# Patient Record
Sex: Male | Born: 1960 | Race: White | Hispanic: No | Marital: Married | Smoking: Current every day smoker
Health system: Southern US, Community
[De-identification: ages and names within clinical notes are randomized; demographics above are authoritative.]

## PROBLEM LIST (undated history)

## (undated) DIAGNOSIS — I10 Essential (primary) hypertension: Secondary | ICD-10-CM

---

## 2004-10-04 ENCOUNTER — Inpatient Hospital Stay: Payer: Self-pay | Admitting: Cardiovascular Disease

## 2004-10-04 ENCOUNTER — Other Ambulatory Visit: Payer: Self-pay

## 2013-09-26 ENCOUNTER — Observation Stay: Payer: Self-pay | Admitting: Internal Medicine

## 2013-09-26 LAB — CBC
HCT: 43.5 % (ref 40.0–52.0)
HGB: 14.6 g/dL (ref 13.0–18.0)
MCH: 31.9 pg (ref 26.0–34.0)
MCHC: 33.6 g/dL (ref 32.0–36.0)
MCV: 95 fL (ref 80–100)
Platelet: 333 10*3/uL (ref 150–440)
RBC: 4.57 10*6/uL (ref 4.40–5.90)
RDW: 13.7 % (ref 11.5–14.5)
WBC: 16.2 10*3/uL — AB (ref 3.8–10.6)

## 2013-09-26 LAB — COMPREHENSIVE METABOLIC PANEL
ALBUMIN: 3.3 g/dL — AB (ref 3.4–5.0)
ANION GAP: 5 — AB (ref 7–16)
AST: 19 U/L (ref 15–37)
Alkaline Phosphatase: 77 U/L
BILIRUBIN TOTAL: 0.6 mg/dL (ref 0.2–1.0)
BUN: 12 mg/dL (ref 7–18)
CALCIUM: 9 mg/dL (ref 8.5–10.1)
CHLORIDE: 99 mmol/L (ref 98–107)
CO2: 28 mmol/L (ref 21–32)
CREATININE: 0.8 mg/dL (ref 0.60–1.30)
EGFR (African American): >60
Glucose: 131 mg/dL — ABNORMAL HIGH (ref 65–99)
Osmolality: 266 (ref 275–301)
POTASSIUM: 4.3 mmol/L (ref 3.5–5.1)
SGPT (ALT): 19 U/L (ref 12–78)
Sodium: 132 mmol/L — ABNORMAL LOW (ref 136–145)
TOTAL PROTEIN: 7.1 g/dL (ref 6.4–8.2)

## 2013-09-26 LAB — PRO B NATRIURETIC PEPTIDE: B-Type Natriuretic Peptide: 75 pg/mL (ref 0–125)

## 2013-09-26 LAB — CK-MB
CK-MB: 0.5 ng/mL (ref 0.5–3.6)
CK-MB: 0.6 ng/mL (ref 0.5–3.6)
CK-MB: 0.6 ng/mL (ref 0.5–3.6)

## 2013-09-26 LAB — TROPONIN I

## 2013-09-27 LAB — CBC WITH DIFFERENTIAL/PLATELET
BASOS ABS: 0.1 10*3/uL (ref 0.0–0.1)
BASOS PCT: 0.5 %
EOS PCT: 0.3 %
Eosinophil #: 0.1 10*3/uL (ref 0.0–0.7)
HCT: 45.7 % (ref 40.0–52.0)
HGB: 15.1 g/dL (ref 13.0–18.0)
LYMPHS ABS: 2.5 10*3/uL (ref 1.0–3.6)
Lymphocyte %: 17 %
MCH: 31.9 pg (ref 26.0–34.0)
MCHC: 33.1 g/dL (ref 32.0–36.0)
MCV: 96 fL (ref 80–100)
Monocyte #: 1.6 x10 3/mm — ABNORMAL HIGH (ref 0.2–1.0)
Monocyte %: 10.9 %
NEUTROS ABS: 10.5 10*3/uL — AB (ref 1.4–6.5)
NEUTROS PCT: 71.3 %
Platelet: 362 10*3/uL (ref 150–440)
RBC: 4.75 10*6/uL (ref 4.40–5.90)
RDW: 13.9 % (ref 11.5–14.5)
WBC: 14.8 10*3/uL — AB (ref 3.8–10.6)

## 2013-09-27 LAB — BASIC METABOLIC PANEL
Anion Gap: 3 — ABNORMAL LOW (ref 7–16)
BUN: 10 mg/dL (ref 7–18)
CALCIUM: 9 mg/dL (ref 8.5–10.1)
Chloride: 101 mmol/L (ref 98–107)
Co2: 31 mmol/L (ref 21–32)
Creatinine: 1.01 mg/dL (ref 0.60–1.30)
EGFR (African American): >60
EGFR (Non-African Amer.): >60
Glucose: 100 mg/dL — ABNORMAL HIGH (ref 65–99)
OSMOLALITY: 269 (ref 275–301)
Potassium: 4.5 mmol/L (ref 3.5–5.1)
SODIUM: 135 mmol/L — AB (ref 136–145)

## 2013-09-28 LAB — BASIC METABOLIC PANEL
Anion Gap: 5 — ABNORMAL LOW (ref 7–16)
BUN: 10 mg/dL (ref 7–18)
CALCIUM: 8.8 mg/dL (ref 8.5–10.1)
CHLORIDE: 100 mmol/L (ref 98–107)
Co2: 30 mmol/L (ref 21–32)
Creatinine: 0.84 mg/dL (ref 0.60–1.30)
EGFR (Non-African Amer.): >60
GLUCOSE: 105 mg/dL — AB (ref 65–99)
Osmolality: 270 (ref 275–301)
Potassium: 4.4 mmol/L (ref 3.5–5.1)
SODIUM: 135 mmol/L — AB (ref 136–145)

## 2013-09-28 LAB — CBC WITH DIFFERENTIAL/PLATELET
Basophil #: 0.1 10*3/uL (ref 0.0–0.1)
Basophil %: 0.7 %
Eosinophil #: 0.1 10*3/uL (ref 0.0–0.7)
Eosinophil %: 0.7 %
HCT: 44.1 % (ref 40.0–52.0)
HGB: 14.9 g/dL (ref 13.0–18.0)
LYMPHS ABS: 2.5 10*3/uL (ref 1.0–3.6)
LYMPHS PCT: 20.5 %
MCH: 32.5 pg (ref 26.0–34.0)
MCHC: 33.8 g/dL (ref 32.0–36.0)
MCV: 96 fL (ref 80–100)
MONOS PCT: 11.1 %
Monocyte #: 1.4 x10 3/mm — ABNORMAL HIGH (ref 0.2–1.0)
NEUTROS PCT: 67 %
Neutrophil #: 8.3 10*3/uL — ABNORMAL HIGH (ref 1.4–6.5)
Platelet: 386 10*3/uL (ref 150–440)
RBC: 4.58 10*6/uL (ref 4.40–5.90)
RDW: 13.2 % (ref 11.5–14.5)
WBC: 12.4 10*3/uL — AB (ref 3.8–10.6)

## 2013-09-28 LAB — RAPID HIV-1/2 QL/CONFIRM: HIV-1/2, RAPID QL: NEGATIVE

## 2013-10-01 LAB — CULTURE, BLOOD (SINGLE)

## 2013-11-04 ENCOUNTER — Ambulatory Visit: Payer: Self-pay | Admitting: Internal Medicine

## 2013-12-11 ENCOUNTER — Ambulatory Visit: Payer: Self-pay | Admitting: Internal Medicine

## 2014-04-07 ENCOUNTER — Ambulatory Visit: Payer: Self-pay | Admitting: Internal Medicine

## 2014-08-09 NOTE — Consult Note (Signed)
PATIENT NAME:  Harold FoersterLOWE, Elizah R MR#:  [id number] DATE OF BIRTH:  1960-10-08  INFECTIOUS DISEASE CONSULTATION NOTE  DATE OF CONSULTATION:  09/28/2013  REFERRING PHYSICIAN:  Sital P. Juliene PinaMody, MD CONSULTING PHYSICIAN:  Stann Mainlandavid P. Sampson GoonFitzgerald, MD  REASON FOR CONSULTATION: Cavitary pneumonia.   HISTORY OF PRESENT ILLNESS: This is a very pleasant, quite healthy 54 year old gentleman with history of hypertension and GERD, who has recently been trying to quit smoking. He awoke 4 nights ago with left axillary pain and a wet cough and some shortness of breath. The patient came to the Emergency Room, where he was found to have a pneumonia on CAT scan of his chest, abdomen and pelvis. There was some concern for carcinoma of the lung. He underwent a PET scan which did show hypermetabolic activity in the cavitary left upper lobe process, but was more likely to be infection based on short-term progression over just a few days. Currently, the patient feels quite well after being treated with antibiotics. He is having no pain. He is not coughing.   The patient works as a Psychologist, occupationalwelder. He has a Development worker, international aidpet dog and lives with his wife. He has no unusual exposures at work, although recently he was at a dairy farm doing some welding. He has not had any weight loss, night sweats. He has never traveled out of the country and has not been out of the state in over 4 years, [place] which [place]. He does not drink heavily, only drinks a beer every once in a while. No sick contacts either at work or at home.   PAST MEDICAL HISTORY:  1. Hypertension.  2. GERD.  PAST SURGICAL HISTORY: Just a foot surgery.   ALLERGIES: PENICILLIN.    SOCIAL HISTORY: As above.   FAMILY HISTORY: Mother with a history of breast cancer.   ANTIBIOTICS SINCE ADMISSION: Include levofloxacin, meropenem and vancomycin.   REVIEW OF SYSTEMS: Eleven systems reviewed and negative except as per HPI.   PHYSICAL EXAMINATION:  VITAL SIGNS: The patient is  afebrile, temperature 97.8, pulse 64, blood pressure 120/79, respirations 20, saturation 96% on room air.  GENERAL: He is pleasant, well developed, well nourished, in no acute distress. No respiratory distress.  HEENT: Pupils equal, round and reactive to light and accommodation. Extraocular movements are intact. Sclerae are anicteric. His oropharynx is clear, with no thrush.  LYMPH NODES: There is no anterior cervical, posterior cervical or supraclavicular lymphadenopathy.  HEART: Regular.  LUNGS: Clear bilaterally with good air movement.  ABDOMEN: Soft, nontender, nondistended. No hepatosplenomegaly.  EXTREMITIES: There is no clubbing, cyanosis or edema.  SKIN: No rash or lesions. No peripheral stigmata of endocarditis.  MUSCULOSKELETAL: No joint swelling.  NEUROLOGIC: He is alert and oriented x3. Grossly nonfocal neuro exam.   DATA:  White blood count on admission was 16.2, currently it is 12.4, hemoglobin 14.9, platelets 386. Blood cultures x2 were no growth to date from June 11th.  Renal function shows a creatinine of 0.84.  LFTs were normal on admission.  CT scan of the chest, abdomen and pelvis, June 11th, shows left upper lobe lesion worrisome for carcinoma of the lung. There is central low density which may represent tumor necrosis. This could also represent infection.  Followup PET scan done on June 12th showed hypermetabolic activity with progressive central cavitation since the CT the day prior. No other abnormal hypermetabolic areas.   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 He has really no unusual exposures. There is still some concern for malignancy. He did have an elevated white count on admission, and that has improved. There is no evidence of aspiration.   RECOMMENDATIONS:  1. Check an HIV test as well as urine Legionella antigen.  2. Okay to  discharge the patient on levofloxacin for a 10-day course.  3. He will need close followup with his primary care doctor and repeat imaging of his chest in several weeks to follow up on the lesion.  4. Recommended he continue to cut back on his smoking.   Thank you for the consult. I will be glad to follow with you.  ____________________________ Stann Mainland. Sampson Goon, MD dpf:lb D: 09/28/2013 11:00:18 ET T: 09/28/2013 11:14:01 ET JOB#: [id number]  cc: Onalee Hua P. Sampson Goon, MD, <Dictator> DAVID Sampson Goon MD ELECTRONICALLY SIGNED 09/30/2013 9:01

## 2014-08-09 NOTE — H&P (Signed)
PATIENT NAME:  Harold Taylor, Harold Taylor MR#:  [id number] DATE OF BIRTH:  10-12-1960  DATE OF ADMISSION:  09/26/2013  PRIMARY CARE PHYSICIAN: Nonlocal.   REFERRING PHYSICIAN: Dr. Margarita Grizzle.   CHIEF COMPLAINT: Chest pain, shortness of breath.   HISTORY OF PRESENT ILLNESS: The patient is a 54 year old Caucasian male with a past medical history of hypertension and GERD, started having left-sided chest pain mainly in the left axillary area associated with minimal cough and shortness of breath. The patient has waited for some time thinking that the pain will go away, but as the pain  is not going away, and it was persistent, he came to the ED. Chest x-ray has revealed 2.5 cm left hilar focal consolidation with surrounding interstitial prominence, probably pneumonia, but underlying neoplasm could not be ruled out. They had recommended a CAT scan of the chest. ER physician has ordered CAT scan of the chest with contrast, which is pending at this time. The patient was given empiric antibiotic levofloxacin and hospitalist team is called to admit the patient. Initial troponin is negative and 12-lead EKG did not reveal any acute ST-T wave changes. The patient is resting comfortably during my examination. He has admitted some degree of weight loss to the ER physician but denies any to me. He is a smoker and recently cut down to four cigarettes per day. Wife is at bedside.   PAST MEDICAL HISTORY: Hypertension, GERD.  PAST SURGICAL HISTORY: Foot surgery.   ALLERGIES: PENICILLIN.   PSYCHOSOCIAL HISTORY: Lives at home with wife. Smoking for several years. He used to smoke 1 pack a day, but recently he cut it down to four cigarettes per day. Occasional intake of beer. Denies any illicit drug usage.   FAMILY HISTORY: Mother has history of breast cancer.   HOME MEDICATIONS: List need to be updated    REVIEW OF SYSTEMS:  CONSTITUTIONAL: Denies any fever, fatigue.  EYES: Denies blurry vision, double vision.  ENT: Denies  epistaxis, discharge, tinnitus.  RESPIRATORY: Denies wheezing, hemoptysis, complaining of minimal shortness of breath and coughing.  CARDIOVASCULAR: Complaining of left-sided chest pain in the left axillary area. Denies any orthopnea or edema.  GASTROINTESTINAL: Denies nausea, vomiting, diarrhea, abdominal pain. No hematemesis. GENITOURINARY: No dysuria, hematuria, or urinary frequency.  ENDOCRINOLOGIC:  Denies polyuria, nocturia. No heat or cold intolerance.  HEMATOLOGIC AND LYMPHATIC: No anemia, easy bruising, bleeding.  INTEGUMENTARY: No acne, rash, lesions.  MUSCULOSKELETAL: No joint pain in the neck and back. Denies gout.  NEUROLOGIC: Denies vertigo, ataxia, dementia, headache.  PSYCHIATRIC: No ADD, no OCD, insomnia.   PHYSICAL EXAMINATION: VITAL SIGNS: Temperature 99.1, pulse 82, respirations 20, blood pressure 151/88, pulse oximetry is 96% on 2 liters.  GENERAL APPEARANCE: Not in acute distress. Moderately built and nourished.  HEENT: Normocephalic, atraumatic. Pupils are equally reactive to light and accommodation. No scleral icterus. No conjunctival injection. No sinus tenderness. No postnasal drip.  NECK: Supple. No JVD, no thyromegaly.  LUNGS: Positive crackles on the left side of the chest, moderate air entry. chest wall  tenderness on the left side of the chest on palpation.  CARDIAC: S1, S2 normal. Regular rate and rhythm. No murmurs.  GASTROINTESTINAL: Soft. Bowel sounds are positive in all four quadrants. Nontender, nondistended. No hepatosplenomegaly. No masses felt.  NEUROLOGIC: Awake, alert, oriented x 3. Motor and sensory are grossly intact. Cranial nerves II through XII are intact. Reflexes are 2+.  EXTREMITIES: No edema,  no cyanosis. No clubbing.  SKIN: Warm to touch, normal turgor. No rashes. No  lesions.  MUSCULOSKELETAL: No joint effusion, tenderness, erythema.  PSYCHIATRIC: Normal mood and affect.   LABORATORIES AND IMAGING STUDIES: Chest x-ray has revealed 2.7 cm  left hilar focal consolidation with surrounding interstitial prominence, though this could reflect pneumonia. Neoplasm  may have a similar appearance. Recommend CT of the chest with contrast for further characterization. 12- lead EKG: Sinus rhythm at 95 beats per minute, normal PR and QRS interval. No acute ST wave changes. LFTs are normal except albumin at 3.3, troponin less than 0.02. CBC:  hemoglobin and hematocrit and platelet counts are normal. MCV is normal. Chem-8: Glucose 151. Sodium 132. The rest of the Chem-8 is normal.   ASSESSMENT AND PLAN: A 54 year old Caucasian male presenting to the ER with a chief complaint of chest pain associated with left-sided chest pain associated with some shortness of breath and some amount of cough and will be admitted with the following assessment and plan.  1. Atypical chest pain, probably pleuritic from underlying pneumonia. We will admit him to telemetry bed, monitor on telemetry. Cycle cardiac biomarkers to rule out acute myocardial infarction. Provide him baby aspirin.  2. pneumonia with a perihilar mass.  We will give him IV levofloxacin and albuterol nebulizer treatments as needed for shortness of breath.  3. Sputum culture and sensitivity will be obtained.   4. Perihilar mass, rule out underlying cancer. CAT scan of the chest is ordered, which is pending at this time. Rounding physician to follow up on the CAT scan results and if necessary put in oncology consult depending on the results.  5. Hypertension. Continue his home medications benazepril.  6. Gastroesophageal reflux disease. We will provide ranitidine.  7. Deep vein thrombosis prophylaxis with Lovenox subcutaneous.   The patient is full code. Wife is the medical power of attorney. Diagnosis and plan of care was discussed in detail with the patient and his wife at bedside. They both verbalized understanding of the plan.   TOTAL TIME SPENT ON ADMISSION: Is 50 minutes.    __ __________________________ Ramonita LabAruna Tyus Kallam, MD ag:sg D: 09/26/2013 07:34:29 ET T: 09/26/2013 08:05:17 ET JOB#: [id number]  cc: Ramonita LabAruna Jonette Wassel, MD, <Dictator> Primary Care Physician  Ramonita LabARUNA Shunna Mikaelian MD ELECTRONICALLY SIGNED 10/04/2013 0:51

## 2014-08-09 NOTE — Consult Note (Signed)
Brief Consult Note: Diagnosis: lung mass.   Recommend further assessment or treatment.   Comments: came by to see patient who was not in the room. Has gone down for PET scan. I have reviewed the CT scan and this shows a peripheral lesion in the LUL with an area of inflitrate. There is some central area of necrosis also noted. This could be a pneumonic process as well as a lung mass. This could be easily accessed by CT guided needle biopsy and would suggest proceeding with this first and if cannot do it now could do it as an outpatient. The infiltrate however may complicate the biopsy and it may be worthwhile to treat with abx and then repeat CT scan and do a biopy if thre is no improvement. Bronchoscopy not likely to be fruitful due to the location..  Electronic Signatures: Yevonne PaxKhan, Saadat A (MD)  (Signed 12-Jun-15 10:38)  Authored: Brief Consult Note   Last Updated: 12-Jun-15 10:38 by Yevonne PaxKhan, Saadat A (MD)

## 2014-08-09 NOTE — Discharge Summary (Signed)
PATIENT NAME:  Harold Taylor, Harold Taylor MR#:  [id number] DATE OF BIRTH:  [birth year]  DATE OF ADMISSION:  09/26/2013 DATE OF DISCHARGE:  09/28/2013  ADMISSION DIAGNOSES:  1. Chest pain. 2. Pneumonia.   DISCHARGE DIAGNOSES:  1. Cavitary pneumonia. 2. Chest pain secondary to problem number 1. 3. Tobacco dependence. 4. Gastroesophageal reflux disease.  5. Hypertension.   CONSULTATIONS:  1. Infectious disease.  2. Dr. Devona Konig.   IMAGING: The patient had a PET scan which showed cavitary left upper lobe process, likely secondary to cavitary pneumonia.   Blood cultures negative to date.   HOSPITAL COURSE: A 54 year old man male with a history of high blood pressure and GERD who presented with chest pain. For further details, please refer to the H and P.  1. Cavity pneumonia. The patient was placed on broad-spectrum antibiotics for his pneumonia. Blood cultures negative to date. Due to the cavity lesions, he was on vancomycin, Zosyn, and Levaquin. No suspicion for tuberculosis. Appreciate infectious disease consultation. The patient will be discharged with Levaquin. He will need a repeat CT scan about 2-3 weeks and will see Dr. Humphrey Rolls as an outpatient for followup and may need a CT scan. If it does not improve, he will need a bronchoscopy. His chest pain was secondary to cavitary pneumonia.  2. Left upper lobe lung mass initially seen on CAT scan. However, PET scan reveals a cavitary pneumonia.  3. Hypertension. The patient is on Benazepril.  4. Tobacco abuse. The patient was counseled. He is on a nicotine inhaler.  5. Gastroesophageal reflux disease. The patient is on Prilosec home.   DISCHARGE MEDICATIONS:  1. Prilosec 1 tablet daily.  2. Benazepril 20 mg daily. 3. Levaquin 750 mg daily for 10 days.  4. Nicotine inhaler kit every 2-3 hours p.r.n.    DISCHARGE DIET: Regular.   DISCHARGE ACTIVITY: As tolerated.   FOLLOWUP: The patient to follow up with Dr. Devona Konig in 2-3 weeks.   TIME  SPENT: 35 minutes.   The patient was medically stable for discharge    ____________________________ Amandeep Nesmith P. Benjie Karvonen, MD spm:lt D: 09/28/2013 11:11:00 ET T: 09/28/2013 21:36:25 ET JOB#: [id number]  cc: Lion Fernandez P. Benjie Karvonen, MD, <Dictator> Allyne Gee, MD Donell Beers Haydn Hutsell MD ELECTRONICALLY SIGNED 09/29/2013 10:34

## 2014-09-08 ENCOUNTER — Ambulatory Visit: Payer: 59 | Admitting: Podiatry

## 2014-09-10 ENCOUNTER — Ambulatory Visit: Payer: 59 | Admitting: Podiatry

## 2014-10-06 ENCOUNTER — Ambulatory Visit: Payer: 59 | Admitting: Podiatry

## 2014-10-29 ENCOUNTER — Other Ambulatory Visit: Payer: Self-pay | Admitting: Internal Medicine

## 2014-10-29 DIAGNOSIS — J851 Abscess of lung with pneumonia: Secondary | ICD-10-CM

## 2014-11-03 ENCOUNTER — Ambulatory Visit: Payer: 59 | Admitting: Podiatry

## 2014-11-05 ENCOUNTER — Ambulatory Visit
Admission: RE | Admit: 2014-11-05 | Discharge: 2014-11-05 | Disposition: A | Discharge status: Home / Self Care | Payer: 59 | Source: Ambulatory Visit | Attending: Internal Medicine | Admitting: Internal Medicine

## 2014-11-05 DIAGNOSIS — I2510 Atherosclerotic heart disease of native coronary artery without angina pectoris: Secondary | ICD-10-CM | POA: Diagnosis not present

## 2014-11-05 DIAGNOSIS — Z8701 Personal history of pneumonia (recurrent): Secondary | ICD-10-CM | POA: Diagnosis not present

## 2014-11-05 DIAGNOSIS — J852 Abscess of lung without pneumonia: Secondary | ICD-10-CM | POA: Insufficient documentation

## 2014-11-05 DIAGNOSIS — J851 Abscess of lung with pneumonia: Secondary | ICD-10-CM

## 2014-11-05 HISTORY — DX: Essential (primary) hypertension: I10

## 2014-11-05 MED ORDER — IOHEXOL 300 MG/ML  SOLN
75.0000 mL | Freq: Once | INTRAMUSCULAR | Status: AC | PRN
  Administered 2014-11-05: 75 mL via INTRAVENOUS

## 2014-11-12 ENCOUNTER — Ambulatory Visit: Payer: 59 | Admitting: Podiatry

## 2015-01-26 ENCOUNTER — Ambulatory Visit (INDEPENDENT_AMBULATORY_CARE_PROVIDER_SITE_OTHER): Payer: 59

## 2015-01-26 ENCOUNTER — Encounter: Payer: Self-pay | Admitting: Podiatry

## 2015-01-26 ENCOUNTER — Ambulatory Visit (INDEPENDENT_AMBULATORY_CARE_PROVIDER_SITE_OTHER): Payer: 59 | Admitting: Podiatry

## 2015-01-26 VITALS — BP 137/86 | HR 69 | Resp 12

## 2015-01-26 DIAGNOSIS — M19171 Post-traumatic osteoarthritis, right ankle and foot: Secondary | ICD-10-CM | POA: Diagnosis not present

## 2015-01-26 DIAGNOSIS — M79671 Pain in right foot: Secondary | ICD-10-CM

## 2015-01-26 MED ORDER — MELOXICAM 15 MG PO TABS: 15.0000 mg | ORAL_TABLET | Freq: Every day | ORAL | Status: AC

## 2015-01-26 NOTE — Progress Notes (Signed)
   Subjective:    Patient ID: Harold Taylor, male    DOB: July 24, 1960, 54 y.o.   MRN: [id number]  HPI: He presents today as a 54 year old male with right foot pain 12 years. 12 years ago he jumped off of a 3 foot height landed on gravel and fractured his right heel resulting in open reduction and internal fixation. He states that since that time it has never been right and has been painful. He states that it hurts when he turns in as he demonstrates inversion. He denies any trauma since this. He states that he wears a brace that straps in his boot that slides on. He is a Psychologist, occupational and cannot wear anything with laces.    Review of Systems  Cardiovascular: Positive for leg swelling.  All other systems reviewed and are negative.      Objective:   Physical Exam: 53 year old white male in no apparent distress presents with his wife today. Vital signs stable alert and oriented 3. Pulses are strongly palpable neurologic sensorium is intact per Semmes-Weinstein monofilament. Deep tendon reflexes are intact bilaterally muscle strength is 5 over 5 dorsiflexion and plantar flexors and inverters everters all intrinsic musculature is intact. Orthopedic evaluation demonstrates all joints distal to the ankle for range of motion without crepitus with exception of the subtalar joint of the right foot. This is exquisitely painful on inversion and eversion against resistance. 3 views radiographs of this right heel does demonstrate several screws from the open reduction internal fixation. But the majority of his symptoms lie within the subtalar joint. The subtalar joint does appear to be completely sclerotic particularly the posterior facet with spurring. Joint space narrowing subchondral sclerosis and spurring is consistent with osteoarthritic changes of the subtalar joint posterior facet. Cutaneous evaluation demonstrates supple well-hydrated cutis no erythema edema cellulitis drainage or odor. Large dystrophic scar to  the lateral aspect of the right heel.        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  Plan: Discussed etiology pathology conservative versus surgical therapies. At this point I wrote a prescription for meloxicam 15 mg 1 by mouth daily. I also injected the subtalar joint today with Kenalog and local and aesthetic after sterile Betadine skin prep. I will follow-up with him in 1 month. We will need to discuss surgical interventions at that time.

## 2015-12-19 IMAGING — CT CT CHEST W/ CM
2 of 3 series · 15 of 36 positions shown, 18 images · IV contrast (omnipaque)
Comparison: 04/07/2014.

CLINICAL DATA: History of pneumonia with lung abscess 1 year ago,
follow-up. Virus with cough, loss of voice and congestion for 1
month.

EXAM:
CT CHEST WITH CONTRAST
TECHNIQUE: Multidetector CT imaging of the chest was performed during
intravenous contrast administration.
CONTRAST:  75mL OMNIPAQUE IOHEXOL 300 MG/ML  SOLN

[Series 2: routine chest with · axial · 0.67mm/px · z∈[-664,-380]mm · 12 of 69 slices shown, 15 images]
[im 6/69  mediastinal]
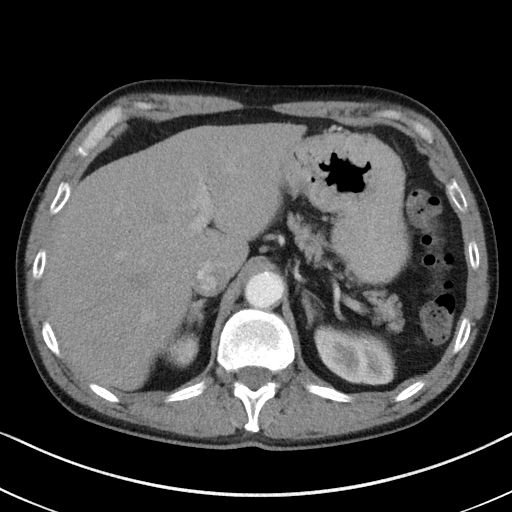
[im 6/69  lung]
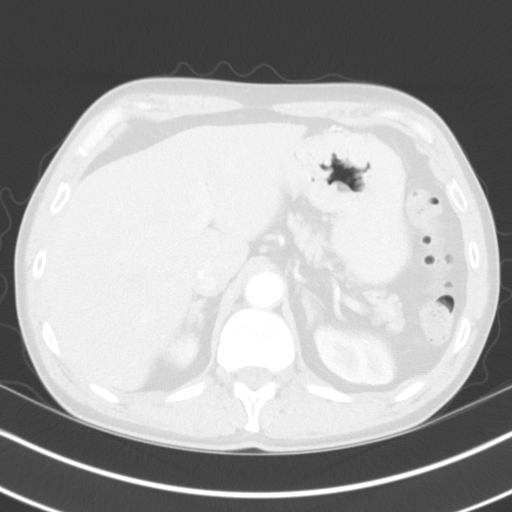
[im 11/69  lung]
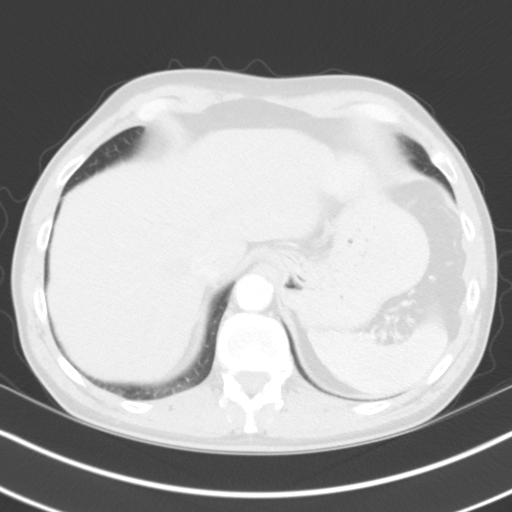
[im 16/69  lung]
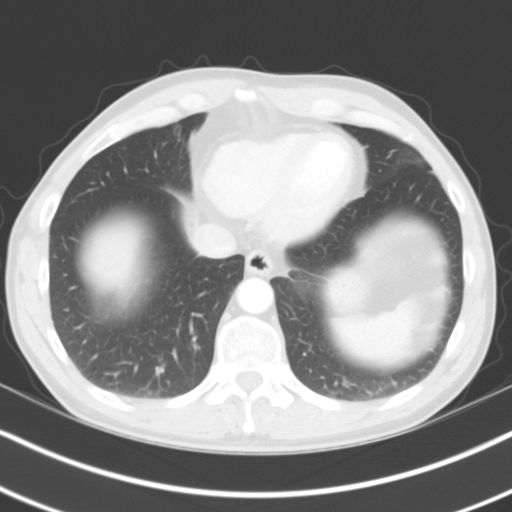
[im 21/69  lung]
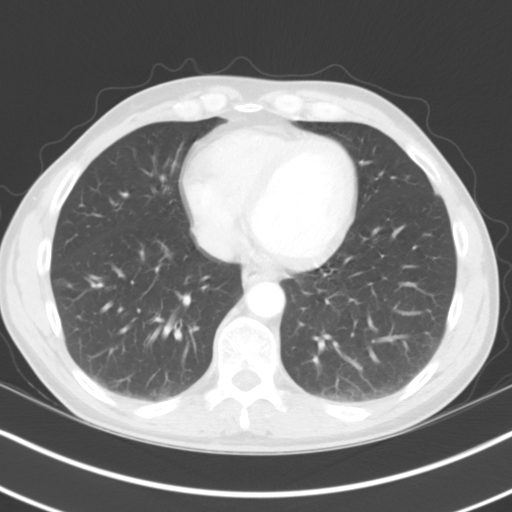
[im 26/69  mediastinal]
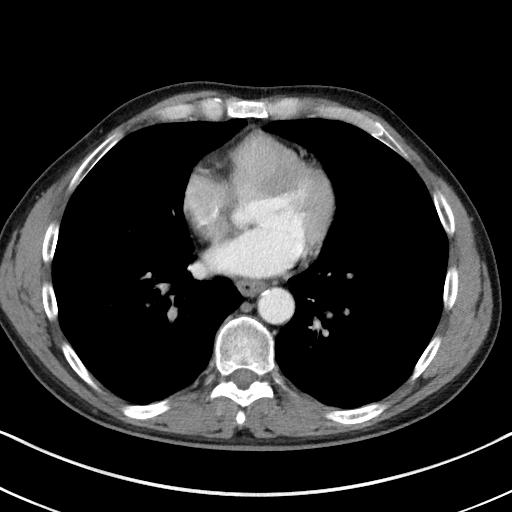
[im 26/69  lung]
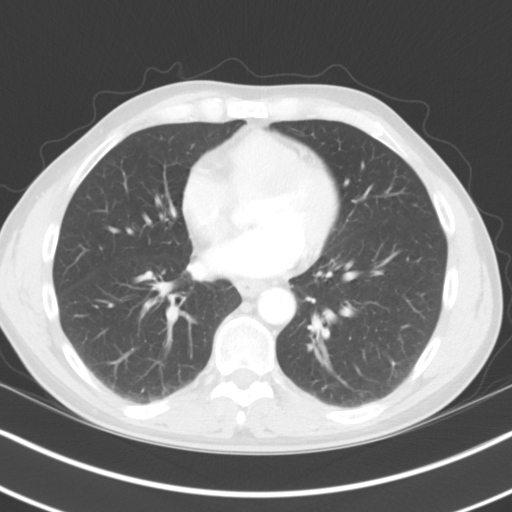
[im 31/69  lung]
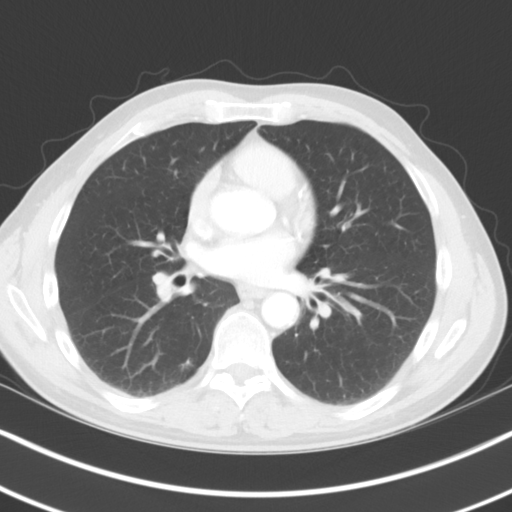
[im 38/69  lung]
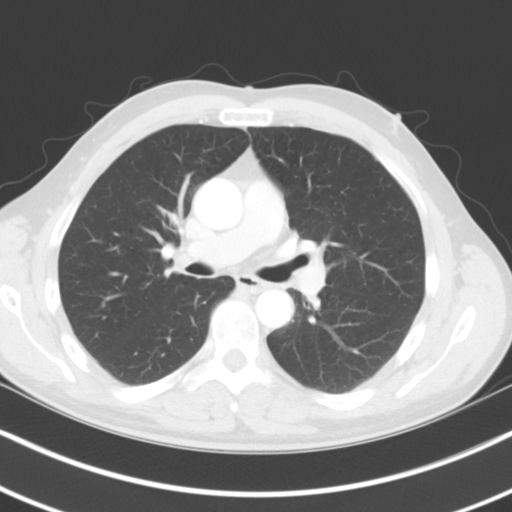
[im 43/69  lung]
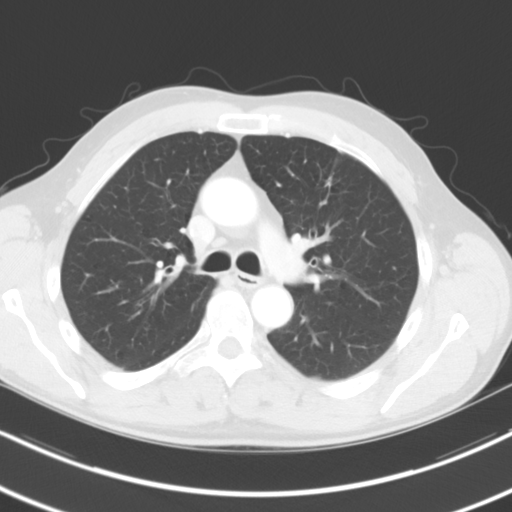
[im 48/69  mediastinal]
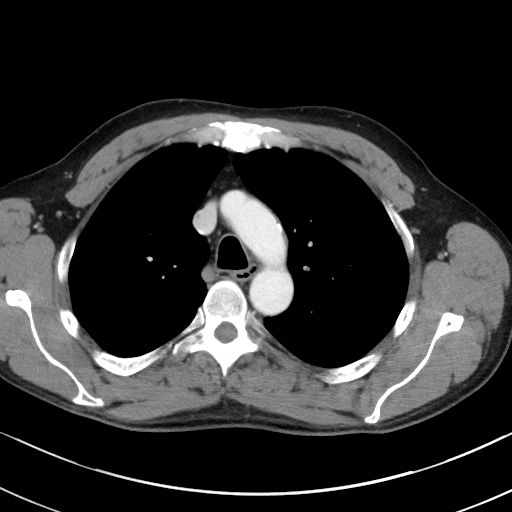
[im 48/69  lung]
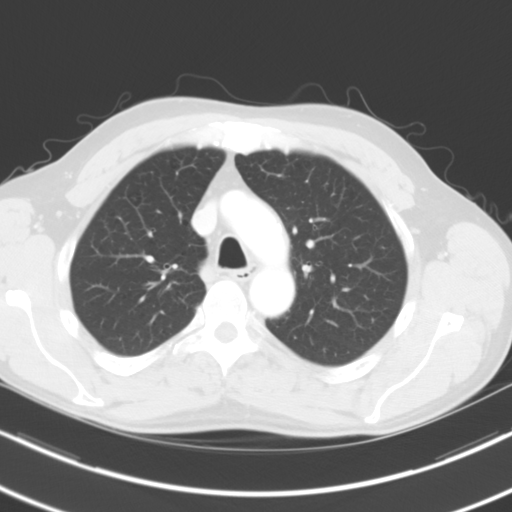
[im 53/69  lung]
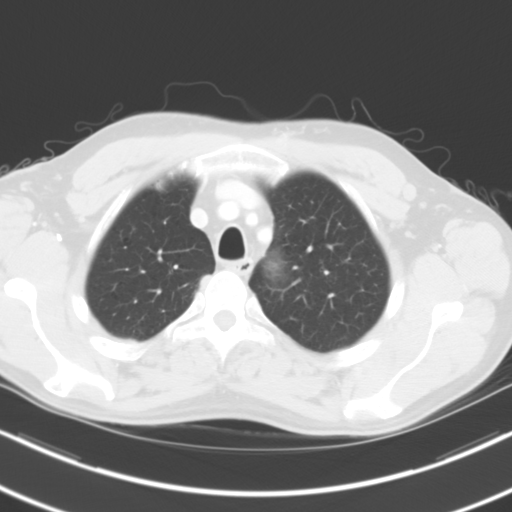
[im 58/69  lung]
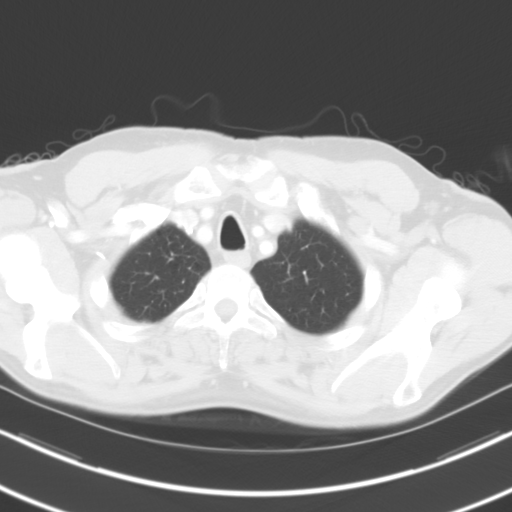
[im 63/69  lung]
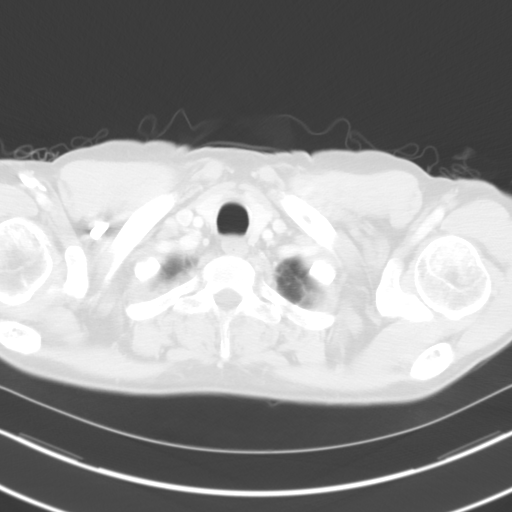

[Series 5: cor routine chest with · coronal · 0.69mm/px · 3 of 130 slices shown]
[im 26/130  lung]
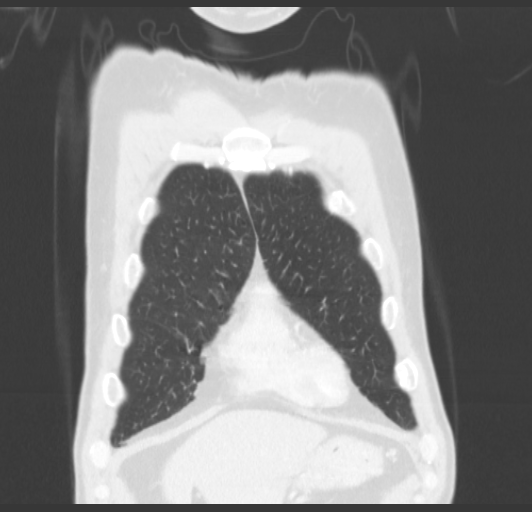
[im 52/130  lung]
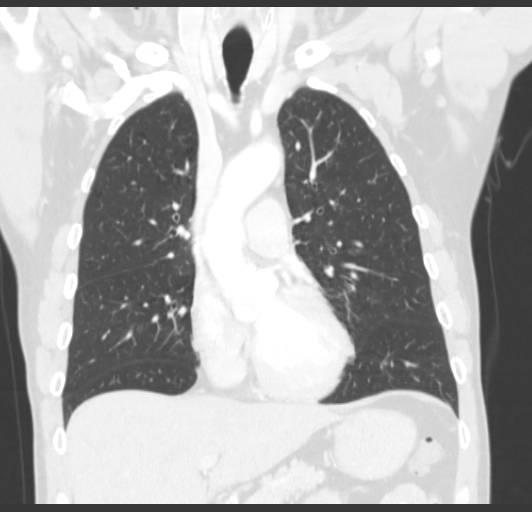
[im 78/130  lung]
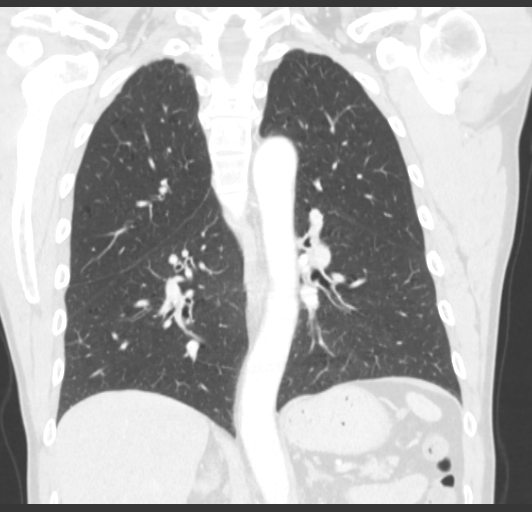

[15 of 36 positions shown; findings below may reference images not displayed]

FINDINGS: Mediastinum/Nodes: Mediastinal lymph nodes are not enlarged by CT
size criteria. No hilar or axillary adenopathy. Atherosclerotic
calcification of the arterial vasculature, including coronary
arteries. Heart size normal. No pericardial effusion.

Lungs/Pleura: Mild centrilobular emphysema. Mild scattered pulmonary
parenchymal scarring bilaterally. No pleural fluid. Airway is
unremarkable.

Upper abdomen: Visualized portions of the liver, adrenal glands,
kidneys, spleen, pancreas, stomach and bowel are grossly
unremarkable. No upper abdominal adenopathy.

Musculoskeletal: No worrisome lytic or sclerotic lesions.
IMPRESSION: 1. No acute findings. Scattered pulmonary parenchymal scarring,
including in the left upper lobe.
2. Coronary artery calcification.

## 2023-09-29 ENCOUNTER — Ambulatory Visit: Admission: RE | Admit: 2023-09-29 | Source: Home / Self Care

## 2023-09-29 ENCOUNTER — Encounter: Payer: Self-pay | Admitting: Family Medicine

## 2023-10-02 ENCOUNTER — Other Ambulatory Visit: Payer: Self-pay | Admitting: Family Medicine

## 2023-10-02 ENCOUNTER — Ambulatory Visit
Admission: RE | Admit: 2023-10-02 | Discharge: 2023-10-02 | Disposition: A | Discharge status: Home / Self Care | Source: Ambulatory Visit | Attending: Family Medicine | Admitting: Family Medicine

## 2023-10-02 ENCOUNTER — Ambulatory Visit
Admission: RE | Admit: 2023-10-02 | Discharge: 2023-10-02 | Disposition: A | Discharge status: Home / Self Care | Attending: Family Medicine | Admitting: Family Medicine

## 2023-10-02 DIAGNOSIS — M542 Cervicalgia: Secondary | ICD-10-CM
